# Patient Record
Sex: Male | Born: 1950 | Race: White | Hispanic: No | Marital: Single | Smoking: Never smoker
Health system: Southern US, Community
[De-identification: ages and names within clinical notes are randomized; demographics above are authoritative.]

## PROBLEM LIST (undated history)

## (undated) DIAGNOSIS — I10 Essential (primary) hypertension: Secondary | ICD-10-CM

---

## 2006-12-10 ENCOUNTER — Ambulatory Visit: Payer: Self-pay | Admitting: Orthopedic Surgery

## 2007-02-18 ENCOUNTER — Ambulatory Visit: Payer: Self-pay | Admitting: Orthopedic Surgery

## 2010-01-04 ENCOUNTER — Emergency Department: Payer: Self-pay | Admitting: Emergency Medicine

## 2015-02-02 ENCOUNTER — Other Ambulatory Visit: Payer: Self-pay | Admitting: Internal Medicine

## 2015-02-02 DIAGNOSIS — R0989 Other specified symptoms and signs involving the circulatory and respiratory systems: Secondary | ICD-10-CM

## 2015-02-04 ENCOUNTER — Ambulatory Visit: Payer: Medicare Other

## 2015-02-11 ENCOUNTER — Ambulatory Visit: Payer: Medicare Other

## 2015-04-13 ENCOUNTER — Ambulatory Visit
Admission: RE | Admit: 2015-04-13 | Discharge: 2015-04-13 | Disposition: A | Discharge status: Home / Self Care | Payer: Commercial Managed Care - HMO | Source: Ambulatory Visit | Attending: Internal Medicine | Admitting: Internal Medicine

## 2015-04-13 DIAGNOSIS — I6523 Occlusion and stenosis of bilateral carotid arteries: Secondary | ICD-10-CM | POA: Diagnosis not present

## 2015-04-13 DIAGNOSIS — R0989 Other specified symptoms and signs involving the circulatory and respiratory systems: Secondary | ICD-10-CM | POA: Insufficient documentation

## 2015-11-15 ENCOUNTER — Ambulatory Visit
Admission: RE | Admit: 2015-11-15 | Discharge: 2015-11-15 | Disposition: A | Discharge status: Home / Self Care | Payer: Commercial Managed Care - HMO | Source: Ambulatory Visit | Attending: Cardiology | Admitting: Cardiology

## 2015-11-15 ENCOUNTER — Encounter
Admission: RE | Disposition: A | Discharge status: Home / Self Care | Payer: Self-pay | Source: Ambulatory Visit | Attending: Cardiology

## 2015-11-15 ENCOUNTER — Ambulatory Visit: Payer: Commercial Managed Care - HMO | Admitting: Certified Registered"

## 2015-11-15 ENCOUNTER — Encounter: Payer: Self-pay | Admitting: *Deleted

## 2015-11-15 DIAGNOSIS — I10 Essential (primary) hypertension: Secondary | ICD-10-CM | POA: Diagnosis not present

## 2015-11-15 DIAGNOSIS — Z806 Family history of leukemia: Secondary | ICD-10-CM | POA: Insufficient documentation

## 2015-11-15 DIAGNOSIS — Z8249 Family history of ischemic heart disease and other diseases of the circulatory system: Secondary | ICD-10-CM | POA: Diagnosis not present

## 2015-11-15 DIAGNOSIS — Z8379 Family history of other diseases of the digestive system: Secondary | ICD-10-CM | POA: Diagnosis not present

## 2015-11-15 DIAGNOSIS — Z79899 Other long term (current) drug therapy: Secondary | ICD-10-CM | POA: Diagnosis not present

## 2015-11-15 DIAGNOSIS — Z823 Family history of stroke: Secondary | ICD-10-CM | POA: Diagnosis not present

## 2015-11-15 DIAGNOSIS — Z791 Long term (current) use of non-steroidal anti-inflammatories (NSAID): Secondary | ICD-10-CM | POA: Insufficient documentation

## 2015-11-15 DIAGNOSIS — Z8349 Family history of other endocrine, nutritional and metabolic diseases: Secondary | ICD-10-CM | POA: Insufficient documentation

## 2015-11-15 DIAGNOSIS — I480 Paroxysmal atrial fibrillation: Secondary | ICD-10-CM | POA: Insufficient documentation

## 2015-11-15 DIAGNOSIS — I481 Persistent atrial fibrillation: Secondary | ICD-10-CM | POA: Diagnosis not present

## 2015-11-15 DIAGNOSIS — Z800 Family history of malignant neoplasm of digestive organs: Secondary | ICD-10-CM | POA: Diagnosis not present

## 2015-11-15 DIAGNOSIS — Z7901 Long term (current) use of anticoagulants: Secondary | ICD-10-CM | POA: Insufficient documentation

## 2015-11-15 DIAGNOSIS — Z8042 Family history of malignant neoplasm of prostate: Secondary | ICD-10-CM | POA: Insufficient documentation

## 2015-11-15 HISTORY — PX: ELECTROPHYSIOLOGIC STUDY: SHX172A

## 2015-11-15 HISTORY — DX: Essential (primary) hypertension: I10

## 2015-11-15 SURGERY — CARDIOVERSION (CATH LAB)
Anesthesia: General

## 2015-11-15 MED ORDER — SODIUM CHLORIDE 0.9 % IV SOLN
INTRAVENOUS | Status: DC | PRN
  Administered 2015-11-15: 07:00:00 via INTRAVENOUS
  Administered 2015-11-15: 07:00:00

## 2015-11-15 MED ORDER — PROPOFOL 10 MG/ML IV BOLUS
INTRAVENOUS | Status: DC | PRN
  Administered 2015-11-15: 80 mg via INTRAVENOUS

## 2015-11-15 NOTE — Transfer of Care (Signed)
Immediate Anesthesia Transfer of Care Note  Patient: Dale Stewart  Procedure(s) Performed: Procedure(s): CARDIOVERSION (N/A)  Patient Location: PACU and Cath Lab  Anesthesia Type:MAC  Level of Consciousness: sedated and responds to stimulation  Airway & Oxygen Therapy: Patient Spontanous Breathing and Patient connected to nasal cannula oxygen  Post-op Assessment: Report given to RN and Post -op Vital signs reviewed and stable  Post vital signs: Reviewed and stable  Last Vitals:  Vitals:   11/15/15 0742 11/15/15 0743  BP:  111/86  Pulse: 73   Resp: 19 14  Temp:      Last Pain: There were no vitals filed for this visit.       Complications: No apparent anesthesia complications

## 2015-11-15 NOTE — Anesthesia Postprocedure Evaluation (Signed)
Anesthesia Post Note  Patient: Alta CorningMichael Koegel  Procedure(s) Performed: Procedure(s) (LRB): CARDIOVERSION (N/A)  Patient location during evaluation: PACU Anesthesia Type: General Level of consciousness: awake and alert Pain management: pain level controlled Vital Signs Assessment: post-procedure vital signs reviewed and stable Respiratory status: spontaneous breathing, nonlabored ventilation, respiratory function stable and patient connected to nasal cannula oxygen Cardiovascular status: blood pressure returned to baseline and stable Postop Assessment: no signs of nausea or vomiting Anesthetic complications: no    Last Vitals:  Vitals:   11/15/15 0743 11/15/15 0749  BP: 111/86 109/71  Pulse:  74  Resp: 14 16  Temp:      Last Pain: There were no vitals filed for this visit.               Yevette EdwardsJames G Adams

## 2015-11-15 NOTE — Anesthesia Preprocedure Evaluation (Signed)
Anesthesia Evaluation  Patient identified by MRN, date of birth, ID band Patient awake    Reviewed: Allergy & Precautions, H&P , NPO status , Patient's Chart, lab work & pertinent test results, reviewed documented beta blocker date and time   Airway Mallampati: II   Neck ROM: full    Dental  (+) Teeth Intact   Pulmonary neg pulmonary ROS,    Pulmonary exam normal        Cardiovascular hypertension, negative cardio ROS Normal cardiovascular examAtrial Fibrillation      Neuro/Psych negative neurological ROS  negative psych ROS   GI/Hepatic negative GI ROS, Neg liver ROS,   Endo/Other  negative endocrine ROS  Renal/GU negative Renal ROS  negative genitourinary   Musculoskeletal   Abdominal   Peds  Hematology negative hematology ROS (+)   Anesthesia Other Findings Past Medical History: No date: Hypertension History reviewed. No pertinent surgical history. BMI    Body Mass Index:  34.02 kg/m     Reproductive/Obstetrics                             Anesthesia Physical Anesthesia Plan  ASA: III  Anesthesia Plan: General   Post-op Pain Management:    Induction:   Airway Management Planned:   Additional Equipment:   Intra-op Plan:   Post-operative Plan:   Informed Consent: I have reviewed the patients History and Physical, chart, labs and discussed the procedure including the risks, benefits and alternatives for the proposed anesthesia with the patient or authorized representative who has indicated his/her understanding and acceptance.   Dental Advisory Given  Plan Discussed with: CRNA  Anesthesia Plan Comments:         Anesthesia Quick Evaluation

## 2015-11-15 NOTE — Anesthesia Procedure Notes (Signed)
Procedure Name: MAC Performed by: Shahzaib Azevedo Pre-anesthesia Checklist: Patient identified, Emergency Drugs available, Suction available, Patient being monitored and Timeout performed Patient Re-evaluated:Patient Re-evaluated prior to inductionOxygen Delivery Method: Nasal cannula Intubation Type: IV induction       

## 2015-11-15 NOTE — CV Procedure (Signed)
Procedure Note.  Procedure:  DCCV Indication: symptomatic afib Sedation: Moderate sedation per Dept of anesthesia  After informed consent, time out protocol and adequate sedation, Pt received 120J of synchronized biphasic DCCV with conversion to nsr. No immediate complications.

## 2015-11-15 NOTE — Discharge Instructions (Signed)
Electrical Cardioversion, Care After °Refer to this sheet in the next few weeks. These instructions provide you with information on caring for yourself after your procedure. Your health care provider may also give you more specific instructions. Your treatment has been planned according to current medical practices, but problems sometimes occur. Call your health care provider if you have any problems or questions after your procedure. °WHAT TO EXPECT AFTER THE PROCEDURE °After your procedure, it is typical to have the following sensations: °· Some redness on the skin where the shocks were delivered. If this is tender, a sunburn lotion or hydrocortisone cream may help. °· Possible return of an abnormal heart rhythm within hours or days after the procedure. °HOME CARE INSTRUCTIONS °· Take medicines only as directed by your health care provider. Be sure you understand how and when to take your medicine. °· Learn how to feel your pulse and check it often. °· Limit your activity for 48 hours after the procedure or as directed by your health care provider. °· Avoid or minimize caffeine and other stimulants as directed by your health care provider. °SEEK MEDICAL CARE IF: °· You feel like your heart is beating too fast or your pulse is not regular. °· You have any questions about your medicines. °· You have bleeding that will not stop. °SEEK IMMEDIATE MEDICAL CARE IF: °· You are dizzy or feel faint. °· It is hard to breathe or you feel short of breath. °· There is a change in discomfort in your chest. °· Your speech is slurred or you have trouble moving an arm or leg on one side of your body. °· You get a serious muscle cramp that does not go away. °· Your fingers or toes turn cold or blue. °  °This information is not intended to replace advice given to you by your health care provider. Make sure you discuss any questions you have with your health care provider. °  °Document Released: 01/01/2013 Document Revised: 04/03/2014  Document Reviewed: 01/01/2013 °Elsevier Interactive Patient Education ©2016 [employer] ° °

## 2015-11-17 NOTE — H&P (Signed)
Chief Complaint: Chief Complaint  Patient presents with  . Follow-up  NP per tate pressure upper stomach since last thursday its gets worse  . Shortness of Breath  its almost like Im huffing and puffing  . Abnormal ECG  Dr Arlana Pouchate did one  . other  goes to the Gym ---was going 5 days aweek but has a shoulder issues so going 3 x week  Date of Service: 10/11/2015 Date of Birth: 1950/12/28 PCP: Jillene BucksENNY C TATE, MD  History of Present Illness: Mr. Mayme GentaBaughn is a 65 y.o.male patient who presents for evaluation and referral from Dr. Arlana Pouchate after noting atrial fibrillation on electrocardiogram. Patient has no prior cardiac history. Etiology was unclear. Holter monitor revealed atrial fibrillation with variable ventricular response. Echocardiogram showed an ejection fraction of 40% to 45% with mildly enlarged left atrium. Mild MR. He is symptomatic from this. Rate is fairly well-controlled. He is tolerating anticoagulation well. Risk and benefits of cardioversion versus maintaining atrial fibrillation with rate control and anticoagulation versus ablation was discussed. Past Medical and Surgical History  Past Medical History Past Medical History:  Diagnosis Date  . Hypertension   Past Surgical History He has no past surgical history on file.   Medications and Allergies  Current Medications  Current Outpatient Prescriptions  Medication Sig Dispense Refill  . lisinopril (PRINIVIL,ZESTRIL) 20 MG tablet 1 tab by mouth daily  . testosterone cypionate (DEPO-TESTOSTERONE) 200 mg/mL injection  . *vitamin b complex oral 1 tab by mouth daily (Patient not taking: Reported on 10/11/2015 )  . apixaban (ELIQUIS) 5 mg tablet Take 1 tablet (5 mg total) by mouth 2 (two) times daily. 60 tablet 0  . apixaban (ELIQUIS) 5 mg tablet Take 1 tablet (5 mg total) by mouth 2 (two) times daily. 60 tablet 11  . diltiazem (CARDIZEM CD) 120 MG XR capsule Take 1 capsule (120 mg total) by mouth once daily. 30 capsule 11  . naproxen  (NAPROSYN) 500 MG tablet TAKE 1 TABLET BY MOUTH TWICE A DAY AS NEEDED 5   No current facility-administered medications for this visit.   Allergies: Review of patient's allergies indicates no known allergies.  Social and Family History  Social History reports that he has never smoked. He has never used smokeless tobacco. He reports that he drinks about 1.2 oz of alcohol per week He reports that he does not use illicit drugs.  Family History Family History  Problem Relation Age of Onset  . Heart failure Mother  . Stroke Mother  . Prostate cancer Brother  . Heart disease Brother  . Stomach cancer Brother  . Heart disease Brother  . Leukemia Brother  . Cirrhosis Brother  . Thyroid disease Brother   Review of Systems  Review of Systems  Constitutional: Positive for malaise/fatigue. Negative for chills, diaphoresis, fever and weight loss.  HENT: Negative for congestion, ear discharge, hearing loss and tinnitus.  Eyes: Negative for blurred vision.  Respiratory: Positive for shortness of breath. Negative for cough, hemoptysis, sputum production and wheezing.  Cardiovascular: Negative for chest pain, palpitations, orthopnea, claudication, leg swelling and PND.  Gastrointestinal: Negative for abdominal pain, blood in stool, constipation, diarrhea, heartburn, melena, nausea and vomiting.  Genitourinary: Negative for dysuria, frequency, hematuria and urgency.  Musculoskeletal: Negative for back pain, falls, joint pain and myalgias.  Skin: Negative for itching and rash.  Neurological: Negative for dizziness, tingling, focal weakness, loss of consciousness, weakness and headaches.  Endo/Heme/Allergies: Negative for polydipsia. Does not bruise/bleed easily.  Psychiatric/Behavioral: Negative for depression, memory  loss and substance abuse. The patient is not nervous/anxious.   Physical Examination   Vitals:BP 142/80 (BP Location: Left upper arm, Patient Position: Sitting, BP Cuff Size: Adult)   Pulse 88  Resp 12  Ht 185.4 cm (6\' 1" )  Wt (!) 121.7 kg (268 lb 6.4 oz)  BMI 35.41 kg/m2 Ht:185.4 cm (6\' 1" ) Wt:(!) 121.7 kg (268 lb 6.4 oz) JYN:WGNFBSA:Body surface area is 2.5 meters squared. Body mass index is 35.41 kg/(m^2).  Wt Readings from Last 3 Encounters:  10/11/15 (!) 121.7 kg (268 lb 6.4 oz)  07/08/14 (!) 114.8 kg (253 lb)  04/28/10 (!) 114.8 kg (253 lb)   BP Readings from Last 3 Encounters:  10/11/15 142/80  07/08/14 134/82  04/28/10 (!) 165/105   General appearance appears in no acute distress  Head Mouth and Eye exam Normocephalic, without obvious abnormality, atraumatic Dentition is good Eyes appear anicteric   Neck exam Thyroid: normal  Nodes: no obvious adenopathy  LUNGS Breath Sounds: Normal Percussion: Normal  CARDIOVASCULAR JVP CV wave: no HJR: no Elevation at 90 degrees: None Carotid Pulse: normal pulsation bilaterally Bruit: None Apex: apical impulse normal  Auscultation Rhythm: atrial fibrillation S1: normal S2: normal Clicks: no Rub: no Murmurs: no murmurs  Gallop: None ABDOMEN Liver enlargement: no Pulsatile aorta: no Ascites: no Bruits: no  EXTREMITIES Clubbing: no Edema: trace to 1+ bilateral pedal edema Pulses: peripheral pulses symmetrical Femoral Bruits: no Amputation: no SKIN Rash: no Cyanosis: no Embolic phemonenon: no Bruising: no NEURO Alert and Oriented to person, place and time: yes Non focal: yes  PSYCH: Pt appears to have normal affect  Diagnostic Studies Reviewed:  EKG EKG demonstrated atrial fibrillation, rate 116-120. No ischemia noted.  Assessment and Plan   65 y.o. male with  ICD-10-CM ICD-9-CM  1. Paroxysmal atrial fibrillation (CMS-HCC)-patient appears to be new onset atrial fibrillation. Appears to be in chronic atrial fibrillation or at least persistent. Risks and benefits of cardioversion after anticoagulation were discussed. Plan to proceed with attempt at cardioversion on current meds in  late August after being on anticoagulation for greater than 4 weeks. Further recognitions after this is complete. Avoiding alcohol stimulants were discussed. I48.0 427.31       2. SOB (shortness of breath)-as per above. Received cardioversion to attempt to maintain sinus rhythm. Continue with anticoagulation with apixaban and diltiazem. R06.02 786.05   Return in about 4 weeks (around 11/24/2015).  These notes generated with voice recognition software. I apologize for typographical errors.  Denton ArKENNETH ALAN FATH, MD

## 2017-03-13 ENCOUNTER — Other Ambulatory Visit: Payer: Self-pay | Admitting: Surgery

## 2017-03-13 DIAGNOSIS — M19012 Primary osteoarthritis, left shoulder: Secondary | ICD-10-CM

## 2017-04-04 ENCOUNTER — Ambulatory Visit
Admission: RE | Admit: 2017-04-04 | Discharge: 2017-04-04 | Disposition: A | Discharge status: Home / Self Care | Payer: Medicare HMO | Source: Ambulatory Visit | Attending: Surgery | Admitting: Surgery

## 2017-04-04 DIAGNOSIS — M19012 Primary osteoarthritis, left shoulder: Secondary | ICD-10-CM | POA: Insufficient documentation

## 2017-04-04 DIAGNOSIS — M25712 Osteophyte, left shoulder: Secondary | ICD-10-CM | POA: Insufficient documentation

## 2017-04-04 DIAGNOSIS — M24012 Loose body in left shoulder: Secondary | ICD-10-CM | POA: Diagnosis not present

## 2017-10-19 ENCOUNTER — Other Ambulatory Visit: Payer: Self-pay | Admitting: Cardiology

## 2017-10-19 DIAGNOSIS — G3281 Cerebellar ataxia in diseases classified elsewhere: Secondary | ICD-10-CM

## 2017-10-31 ENCOUNTER — Other Ambulatory Visit: Payer: Self-pay | Admitting: Cardiology

## 2017-10-31 DIAGNOSIS — I10 Essential (primary) hypertension: Secondary | ICD-10-CM

## 2017-11-01 ENCOUNTER — Other Ambulatory Visit
Admission: RE | Admit: 2017-11-01 | Discharge: 2017-11-01 | Disposition: A | Discharge status: Home / Self Care | Payer: Medicare HMO | Source: Ambulatory Visit | Attending: Rehabilitative and Restorative Service Providers" | Admitting: Rehabilitative and Restorative Service Providers"

## 2017-11-01 ENCOUNTER — Ambulatory Visit
Admission: RE | Admit: 2017-11-01 | Discharge: 2017-11-01 | Disposition: A | Discharge status: Home / Self Care | Payer: Medicare HMO | Source: Ambulatory Visit | Attending: Cardiology | Admitting: Cardiology

## 2017-11-01 DIAGNOSIS — G930 Cerebral cysts: Secondary | ICD-10-CM | POA: Diagnosis not present

## 2017-11-01 DIAGNOSIS — I10 Essential (primary) hypertension: Secondary | ICD-10-CM | POA: Diagnosis present

## 2017-11-01 DIAGNOSIS — G3281 Cerebellar ataxia in diseases classified elsewhere: Secondary | ICD-10-CM

## 2017-11-01 DIAGNOSIS — R270 Ataxia, unspecified: Secondary | ICD-10-CM | POA: Diagnosis present

## 2017-11-01 LAB — BASIC METABOLIC PANEL
Anion gap: 11 (ref 5–15)
BUN: 23 mg/dL (ref 8–23)
CALCIUM: 8.6 mg/dL — AB (ref 8.9–10.3)
CO2: 24 mmol/L (ref 22–32)
CREATININE: 1.03 mg/dL (ref 0.61–1.24)
Chloride: 103 mmol/L (ref 98–111)
GFR calc non Af Amer: >60 mL/min (ref >60)
Glucose, Bld: 97 mg/dL (ref 70–99)
Potassium: 3.8 mmol/L (ref 3.5–5.1)
SODIUM: 138 mmol/L (ref 135–145)

## 2017-11-01 MED ORDER — GADOBENATE DIMEGLUMINE 529 MG/ML IV SOLN
20.0000 mL | Freq: Once | INTRAVENOUS | Status: AC | PRN
  Administered 2017-11-01: 20 mL via INTRAVENOUS

## 2017-11-23 ENCOUNTER — Other Ambulatory Visit: Payer: Self-pay | Admitting: Orthopedic Surgery

## 2017-11-23 DIAGNOSIS — M545 Low back pain: Secondary | ICD-10-CM

## 2018-01-30 ENCOUNTER — Other Ambulatory Visit: Payer: Self-pay | Admitting: Neurology

## 2018-01-30 DIAGNOSIS — R29898 Other symptoms and signs involving the musculoskeletal system: Secondary | ICD-10-CM

## 2018-02-19 ENCOUNTER — Ambulatory Visit
Admission: RE | Admit: 2018-02-19 | Discharge: 2018-02-19 | Disposition: A | Discharge status: Home / Self Care | Payer: Medicare HMO | Source: Ambulatory Visit | Attending: Neurology | Admitting: Neurology

## 2018-02-19 DIAGNOSIS — M48061 Spinal stenosis, lumbar region without neurogenic claudication: Secondary | ICD-10-CM | POA: Diagnosis not present

## 2018-02-19 DIAGNOSIS — R222 Localized swelling, mass and lump, trunk: Secondary | ICD-10-CM | POA: Insufficient documentation

## 2018-02-19 DIAGNOSIS — M4317 Spondylolisthesis, lumbosacral region: Secondary | ICD-10-CM | POA: Diagnosis not present

## 2018-02-19 DIAGNOSIS — M545 Low back pain: Secondary | ICD-10-CM | POA: Diagnosis present

## 2018-02-19 DIAGNOSIS — R29898 Other symptoms and signs involving the musculoskeletal system: Secondary | ICD-10-CM

## 2019-06-05 ENCOUNTER — Ambulatory Visit
Admission: RE | Admit: 2019-06-05 | Discharge: 2019-06-05 | Disposition: A | Discharge status: Home / Self Care | Payer: Medicare HMO | Source: Ambulatory Visit | Attending: Internal Medicine | Admitting: Internal Medicine

## 2019-06-05 ENCOUNTER — Other Ambulatory Visit: Payer: Self-pay

## 2019-06-05 DIAGNOSIS — I440 Atrioventricular block, first degree: Secondary | ICD-10-CM | POA: Insufficient documentation

## 2019-06-05 DIAGNOSIS — Z01810 Encounter for preprocedural cardiovascular examination: Secondary | ICD-10-CM | POA: Diagnosis not present

## 2019-12-03 ENCOUNTER — Other Ambulatory Visit: Payer: Self-pay | Admitting: Internal Medicine

## 2019-12-03 DIAGNOSIS — R229 Localized swelling, mass and lump, unspecified: Secondary | ICD-10-CM

## 2019-12-08 ENCOUNTER — Ambulatory Visit: Admission: RE | Admit: 2019-12-08 | Payer: Medicare HMO | Source: Ambulatory Visit

## 2019-12-11 ENCOUNTER — Ambulatory Visit: Admission: RE | Admit: 2019-12-11 | Payer: Medicare HMO | Source: Ambulatory Visit

## 2019-12-18 ENCOUNTER — Other Ambulatory Visit: Payer: Self-pay

## 2019-12-18 ENCOUNTER — Ambulatory Visit
Admission: RE | Admit: 2019-12-18 | Discharge: 2019-12-18 | Disposition: A | Discharge status: Home / Self Care | Payer: Medicare HMO | Source: Ambulatory Visit | Attending: Internal Medicine | Admitting: Internal Medicine

## 2019-12-18 ENCOUNTER — Other Ambulatory Visit
Admission: RE | Admit: 2019-12-18 | Discharge: 2019-12-18 | Disposition: A | Discharge status: Home / Self Care | Payer: Medicare HMO | Source: Ambulatory Visit | Attending: Internal Medicine | Admitting: Internal Medicine

## 2019-12-18 ENCOUNTER — Encounter (INDEPENDENT_AMBULATORY_CARE_PROVIDER_SITE_OTHER): Payer: Self-pay

## 2019-12-18 DIAGNOSIS — R229 Localized swelling, mass and lump, unspecified: Secondary | ICD-10-CM | POA: Diagnosis not present

## 2019-12-18 LAB — CREATININE, SERUM
Creatinine, Ser: 0.99 mg/dL (ref 0.61–1.24)
GFR calc Af Amer: >60 mL/min (ref >60)
GFR calc non Af Amer: >60 mL/min (ref >60)

## 2019-12-18 MED ORDER — IOHEXOL 300 MG/ML  SOLN
75.0000 mL | Freq: Once | INTRAMUSCULAR | Status: AC | PRN
  Administered 2019-12-18: 75 mL via INTRAVENOUS

## 2021-06-18 IMAGING — CT CT NECK W/ CM
2 of 3 series · 8 of 14 positions shown, 10 images · IV contrast (omnipaque)
Comparison: Brain MRI 11/01/2017. Carotid Doppler ultrasound
04/13/2015.

CLINICAL DATA: 69-year-old male with palpable mass on clavicle
discovered 2-3 weeks ago. Associated new difficulty swallowing.

EXAM:
CT NECK WITH CONTRAST
TECHNIQUE: Multidetector CT imaging of the neck was performed using the
standard protocol following the bolus administration of intravenous
contrast.
CONTRAST:  75mL OMNIPAQUE IOHEXOL 300 MG/ML  SOLN

[Series 2: axial neck · axial · 0.66mm/px · z∈[-478,-354]mm · 3 of 125 slices shown]
[im 32/125  bone]
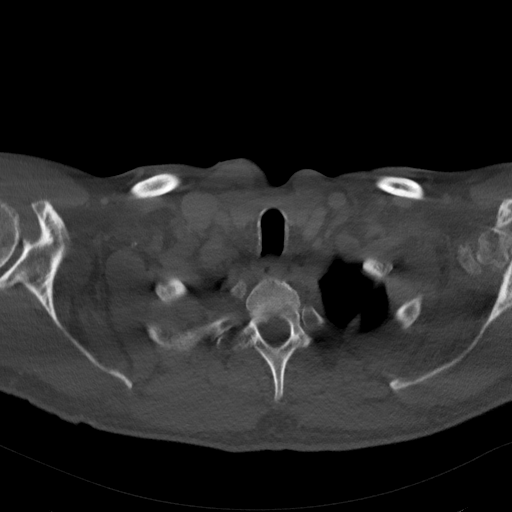
[im 63/125  bone]
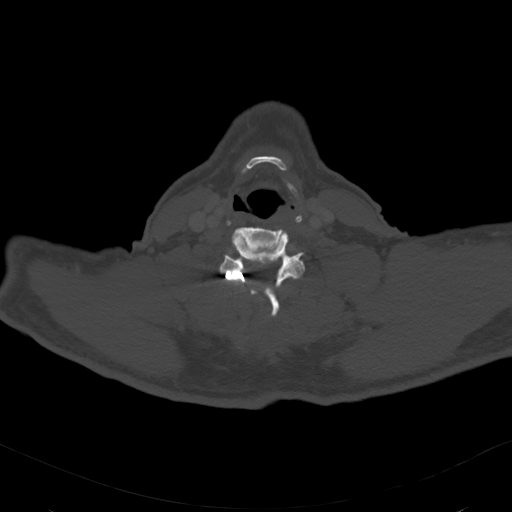
[im 94/125  bone]
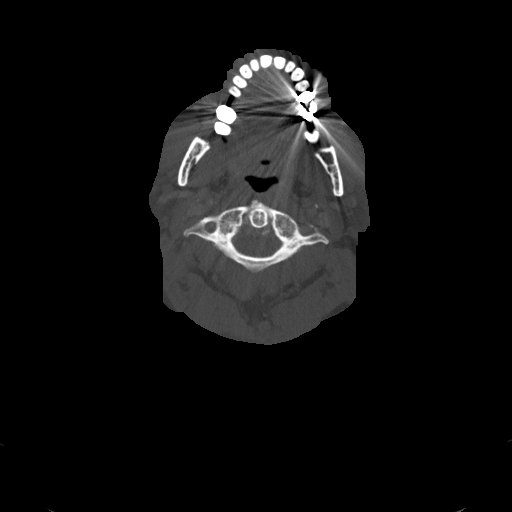

[Series 7: orthogonal ax · axial · 0.49mm/px · z∈[-547,-346]mm · 5 of 155 slices shown, 7 images]
[im 26/155  soft-tissue]
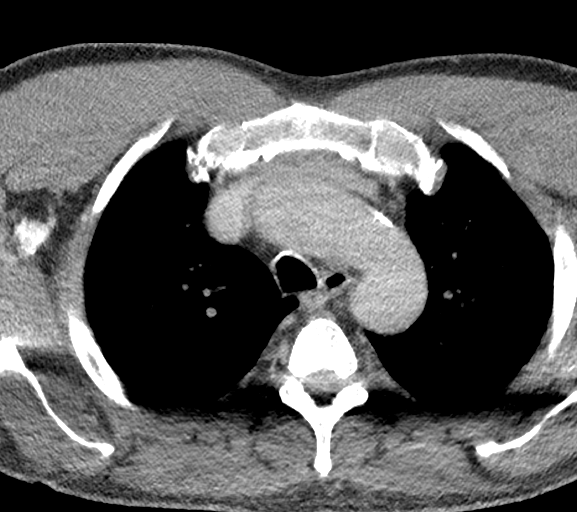
[im 26/155  bone]
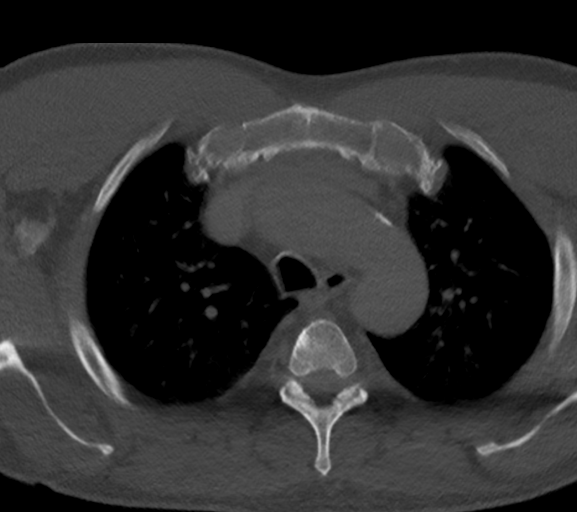
[im 52/155  bone]
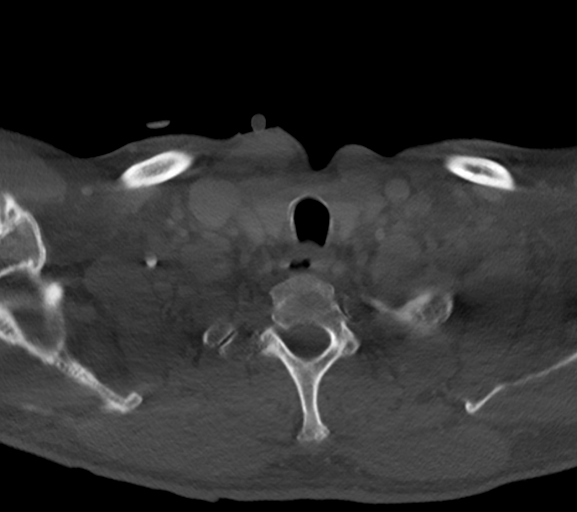
[im 78/155  bone]
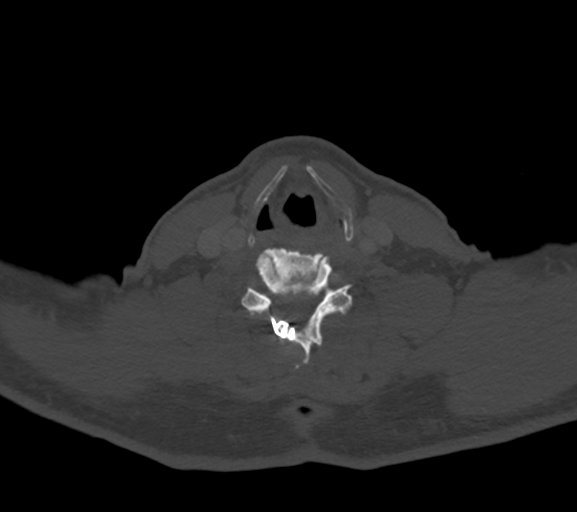
[im 103/155  bone]
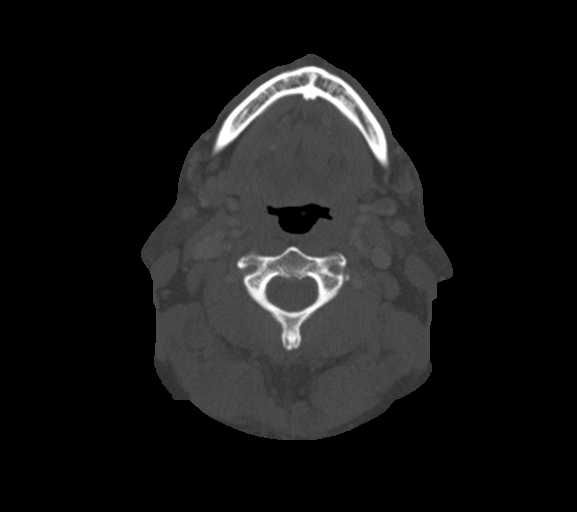
[im 129/155  soft-tissue]
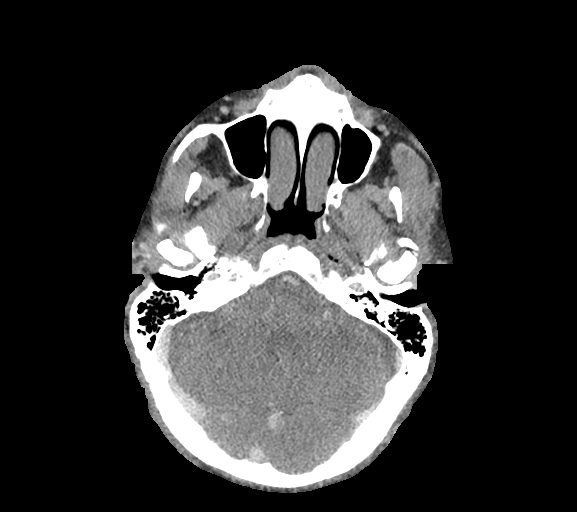
[im 129/155  bone]
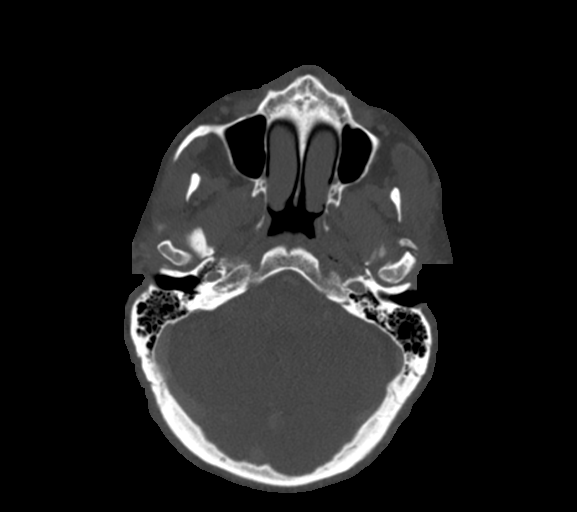

[8 of 14 positions shown; findings below may reference images not displayed]

FINDINGS: Pharynx and larynx: Laryngeal and pharyngeal soft tissue contours
are within normal limits. Parapharyngeal and retropharyngeal spaces
appear normal.

Cervical esophagus appears within normal limits.

Salivary glands: Sublingual space, submandibular glands, and parotid
glands are within normal limits.

Thyroid: Negative.

Lymph nodes: No cervical lymphadenopathy. Among the largest lymph
nodes is a left level 4 node measuring 6 mm short axis (up to 10 mm
on coronal image 66). No acute inflammatory stranding identified in
the neck.

Vascular: Major vascular structures in the neck and at the skull
base appear patent. There is moderate atherosclerosis of the
proximal left ICA.

Limited intracranial: Negative.

Visualized orbits: Negative.

Mastoids and visualized paranasal sinuses: Clear.

Skeleton: Palpable area of concern marked along the anteromedial
margin of the right clavicle on series 2, image 97. And on bone
windows series 4, image 107 and coronal series 6, image 42 severe
asymmetric degeneration of the right sternoclavicular joint is
noted. There is associated abnormal sclerosis of the medial
clavicular head, subchondral cysts, and joint space mildly increased
intermediate density soft tissue. But no aggressive or suspicious
bone changes or features are identified. Subjacent to the
degenerated joint is tortuosity of the proximal great vessels off of
the aortic arch.

Superimposed severe chronic cervical spine degeneration with
multilevel chronic postoperative changes to the right cervical
lamina.

Partially visible bulky degeneration and heterotopic ossification
also at the visible left shoulder.

No acute or suspicious osseous lesion identified.

Upper chest: Negative visible superior mediastinum aside from
tortuosity of the proximal great vessels and calcified
atherosclerosis of the aortic arch. Negative visible thoracic
esophagus. Mild motion artifact in the lung apices which appear
negative.
IMPRESSION: 1. Palpable area of concern corresponds to severe asymmetric
degeneration of the right sternoclavicular joint. No aggressive or
suspicious bone changes or other features are identified.

2. Otherwise largely negative neck CT;
- moderate atherosclerosis of the proximal Left ICA. Repeat Carotid
Doppler Ultrasound might be valuable.
- chronic severe cervical spine degeneration with multilevel
postoperative changes on the right.

## 2023-08-08 NOTE — Progress Notes (Signed)
 Division of Spine Surgery    History of Present Illness: History of Present Illness Dale Stewart is a 73 year old male with chronic hip and sacroiliac joint pain who presents with worsening leg pain.  He experiences worsening pain originating from the sacroiliac joint, radiating down his entire leg, with a noted bone protrusion in the area. The pain has intensified, causing difficulty in management. A recent fall occurred due to his leg giving way, raising concerns about potential damage to previous surgical sites. He has undergone a myelogram CT scan and new x-rays.  He has received sacroiliac joint injections with limited relief. A double dose provided significant improvement, but the pain has since returned, affecting his entire leg, including the knee. Previous hip injections did not provide relief, possibly due to insufficient dosage. He is frustrated with the ongoing pain and its impact on his mobility and quality of life.   Spine Questionnaires:  SPINE SCORING PORTION OF QUESTIONNAIRES NDI SCORE-PERCENTAGE ODI SCORE-PERCENTAGE NECK PAIN ARM PAIN BACK PAIN LEG PAIN PROMIS Pain Interference T-Score (range: 10 - 90) --  03/16/2021  76 (Crippled) 0 (None) 0 (None) 10 (Very Severe) 10 (Very Severe) 73 (severe)*   01/18/2022 8 (No Disability) 48 (Severe Disability) 0 (None) 2 (Mild) 10 (Very Severe) 10 (Very Severe) 62 (moderate)*     SPINE SCORING PORTION OF QUESTIONNAIRES PROMIS Physical Function T-Score [facility] MyChart PROMIS Global Health Scoring (Physical) [facility] MyChart PROMIS Global Health Scoring (Mental)  03/16/2021 26 (severe dysfunction)* 23.5 (Severe Symptoms) 43.5 (Mild Symptoms)  01/18/2022 52 (within normal limits) 37.4 (Moderate Symptoms) 43.5 (Mild Symptoms)       01/18/2022    9:47 AM 01/18/2022    9:46 AM 01/18/2022    9:43 AM 03/16/2021    2:21 PM 03/16/2021    2:20 PM 03/16/2021    2:19 PM 03/16/2021    2:17 PM  SPINE SCORING PORTION OF QUESTIONNAIRES  NDI  SCORE-PERCENTAGE   8 (No Disability)      ODI SCORE-PERCENTAGE   48 (Severe Disability)    76 (Crippled)  NECK PAIN   0 (None)    0 (None)  ARM PAIN   2 (Mild)    0 (None)  BACK PAIN   10 (Very Severe)    10 (Very Severe)  LEG PAIN   10 (Very Severe)    10 (Very Severe)  PROMIS Pain Interference T-Score (range: 10 - 90) 62 (moderate)*   73 (severe)*     --         PROMIS Physical Function T-Score 52 (within normal limits)    26 (severe dysfunction)*    [facility] MyChart PROMIS Global Health Scoring (Physical)  37.4 (Moderate Symptoms)    23.5 (Severe Symptoms)   [facility] MyChart PROMIS Global Health Scoring (Mental)  43.5 (Mild Symptoms)    43.5 (Mild Symptoms)    @spinescorecard @   For PROMIS measures, higher scores equals more of the concept being measured.  PROMIS scores have a mean of 50 and standard deviation (SD) of 10. Scores 0.5 - 1.0 SD worse than the mean = mild symptoms/impairment Scores 1.0 - 2.0 SD worse than the mean = moderate symptoms/impairment Scores 2.0 SD or more worse than the mean = severe symptoms/impairment  Interpretation of PROMIS Scores WNL Mild Symptoms/Impairment Moderate Symptoms/Impairment Severe Symptoms/Impairment  Physical Function (Adult and Pediatric) 45 or greater 40-44 30-39 29 or less  Adult Pain Interference 54 or less 55-59 60-69 70 or greater  Depression 54 or less 55-59 60-69  70 or greater  Sleep Disturbance 54 or less 55-59 60-69 70 or greater  Pediatric or Parent Proxy Pain Interference 50 or less 50-54 55-64 65 or greater  Cognitive Function  45 or greater 40-44  30-39 29 or less   Past Medical History: Past Medical History:  Diagnosis Date  . Atrial fibrillation (CMS/HHS-HCC)   . Cardiomyopathy, secondary (CMS/HHS-HCC)   . GERD (gastroesophageal reflux disease)   . Hypertension      Past Surgical History: Past Surgical History:  Procedure Laterality Date  . POSTERIOR LUMBAR SPINE FUSION ONE LEVEL LATERAL TRANSVERSE TECHNIQUE N/A 12/01/2021    Procedure: **AIRO 1** ARTHRODESIS, POSTERIOR OR POSTEROLATERAL TECHNIQUE, SINGLE LEVEL; LUMBAR (WITH LATERAL TRANSVERSE TECHNIQUE, WHEN PERFORMED); STEREOTACTIC / 3D SONDRA;  Surgeon: Estelle Elsie Agent, MD;  Location: [facility];  Service: Orthopedics;  Laterality: N/A;  . INSTRUMENTATION POSTERIOR SPINE 7 TO 12 VERTEBRAL SEGMENTS N/A 12/01/2021   Procedure: POSTERIOR SEGMENTAL INSTRUMENTATION (EG, PEDICLE FIXATION, DUAL RODS WITH MULTIPLE HOOKS AND SUBLAMINAR WIRES); 3 TO 6 VERTEBRAL SEGMENTS (LIST IN ADDITION TO PRIMARY PROCEDURE);  Surgeon: Estelle Elsie Agent, MD;  Location: [facility];  Service: Orthopedics;  Laterality: N/A;  . ARTHRODESIS POSTERIOR SPINE N/A 12/01/2021   Procedure: ARTHRODESIS, POSTERIOR OR POSTEROLATERAL TECHNIQUE, SINGLE LEVEL; EACH ADDITIONAL VERTEBRAL SEGMENT (LIST IN ADDITION TO PRIMARY PROCEDURE);  Surgeon: Estelle Elsie Agent, MD;  Location: [facility];  Service: Orthopedics;  Laterality: N/A;  . INSERTION MORSELIZED BONE ALLOGRAFT FOR SPINE SURGERY N/A 12/01/2021   Procedure: ALLOGRAFT, MORSELIZED, OR PLACEMENT OF OSTEOPROMOTIVE MATERIAL, FOR SPINE SURGERY ONLY (LIST IN ADDITION TO PRIMARY PROCEDURE);  Surgeon: Estelle Elsie Agent, MD;  Location: [facility];  Service: Orthopedics;  Laterality: N/A;  . AUTOGRAFT OBTAINED SAME INCISION FOR SPINE SURGERY N/A 12/01/2021   Procedure: AUTOGRAFT FOR SPINE SURGERY ONLY (INCL HARVESTING GRAFT); LOCAL (EG, RIBS, SPINOUS PROCESS, OR LAMINAR FRAGMENT) OBTAINED FROM SAME INCISION (LIST IN ADDITION TO PRIMARY PROCEDURE);  Surgeon: Estelle Elsie Agent, MD;  Location: [facility];  Service: Orthopedics;  Laterality: N/A;  . LAMINECTOMY POSTERIOR LUMBAR FACETECTOMY & FORAMINOTOMY W/DECOMP N/A 12/01/2021   Procedure: LAMINECTOMY, FACETECTOMY AND FORAMINOTOMY (UNILATERAL OR BILATERAL WITH DECOMPRESSION OF SPINAL CORD, CAUDA EQUINA AND/OR NERVE ROOT(S), SINGLE VERTEBRAL  SEGMENT; LUMBAR;  Surgeon: Saturnino Peggye Barrio, MD;  Location: [facility];  Service: Neurosurgery;  Laterality: N/A;  . BACK SURGERY     neck and back  . CARDIAC SURGERY     Afib     Medications: Current Outpatient Medications  Medication Sig Dispense Refill  . aspirin-caffeine (BC ARTHRITIS) 1,000-65 mg PwPk Take 1,000 mg by mouth as directed (pain)    . gabapentin (NEURONTIN) 600 MG tablet Take 600 mg by mouth 3 (three) times daily    . lisinopriL (ZESTRIL) 20 MG tablet Take 20 mg by mouth once daily    . morphine (MS CONTIN) 15 MG ER tablet Take 15 mg by mouth every 12 (twelve) hours    . NARCAN 4 mg/actuation nasal spray Place 1 spray into one nostril 2 (two) times daily as needed    . oxyCODONE (ROXICODONE) 15 MG immediate release tablet Take 15 mg by mouth 5 (five) times daily    . testosterone cypionate (DEPO-TESTOSTERONE) 200 mg/mL injection every 14 (fourteen) days    . tiZANidine (ZANAFLEX) 4 MG tablet TAKE 1 TO 2 TABLETS BY MOUTH AT BEDTIME AS NEEDED FOR INSOMNIA    . aspirin 325 MG EC tablet Take 1 tablet (325 mg total) by mouth  once daily for 30 days (Patient not taking: Reported on 11/03/2022) 30 tablet 0   No current facility-administered medications for this visit.    Allergies: Allergies  Allergen Reactions  . Cinnamon Analogues Other (See Comments)    Large doses cause itching in mouth     Social History: Social History   Socioeconomic History  . Marital status: Single  Tobacco Use  . Smoking status: Never  . Smokeless tobacco: Never  . Tobacco comments:    Smoked in his 32s  Vaping Use  . Vaping status: Never Used  Substance and Sexual Activity  . Alcohol use: Not Currently    Alcohol/week: 2.0 standard drinks of alcohol    Types: 2 Glasses of wine per week    Comment: notes he stopped drink once starting pain med.  . Drug use: No  . Sexual activity: Not Currently   Social Drivers of Health   Financial Resource Strain: Medium Risk  (08/08/2023)   Overall Financial Resource Strain (CARDIA)   . Difficulty of Paying Living Expenses: Somewhat hard  Food Insecurity: Food Insecurity Present (08/08/2023)   Hunger Vital Sign   . Worried About Programme researcher, broadcasting/film/video in the Last Year: Sometimes true   . Ran Out of Food in the Last Year: Sometimes true  Transportation Needs: No Transportation Needs (08/08/2023)   PRAPARE - Transportation   . Lack of Transportation (Medical): No   . Lack of Transportation (Non-Medical): No  Housing Stability: Low Risk  (08/08/2023)   Housing Stability Vital Sign   . Unable to Pay for Housing in the Last Year: No   . Number of Times Moved in the Last Year: 0   . Homeless in the Last Year: No  Recent Concern: Housing Stability - High Risk (08/08/2023)   Housing Stability Vital Sign   . Unable to Pay for Housing in the Last Year: Yes   . Homeless in the Last Year: No      Spine Examination:  Inspection: Gait Normal   Heel Walk Not tested  Toe walk Not Tested   Deformity No deformity   Neural Evaluation: Cervical   Spurling's Normal  Hoffman's Normal  Clonus Normal   Thoraco-Lumbar   Femoral Stretch No back or leg pain  SLR No back or leg pain   Motor:  Cervical Right Left  Deltoid 5/5 5/5  Biceps 5/5 5/5  Triceps 5/5 5/5  Wrist Extensor 5/5 5/5  Wrist Flexor 5/5 5/5  Grip  5/5 5/5  Intrinsics  5/5 5/5   Thoraco-Lumbar Right  Left  Psoas  5/5 5/5  Quad 5/5 5/5  Hamstring  5/5 5/5  Tibialis Anterior  5/5 5/5  Gastroc 5/5 5/5  EHL 5/5 5/5  Peroneals  5/5 5/5  FHL 5/5 5/5    Reflexes: Cervical Right  Left  Biceps Normal Normal  Brachioradialis Normal Normal  Triceps Normal Normal  Hoffman Absent Absent  Inverted Brachioradialis Negative Negative   Thoraco-Lumbar Right Left  Patella Normal Normal  Achilles Normal Normal  Babinski Normal Normal  Clonus Absent Absent    Results Reviewed:   Lab Results  Component Value Date   HCT 42.9 11/21/2022   HCT 45.1  11/13/2022   HCT 26.6 (L) 12/05/2021   HGB 15.1 11/21/2022   HGB 15.5 11/13/2022   HGB 9.5 (L) 12/05/2021   CREATININE 1.0 12/03/2021   CREATININE 0.8 12/02/2021   CREATININE 0.7 12/01/2021   WBC 6.2 11/21/2022   WBC 7.8 11/13/2022  WBC 9.8 12/05/2021   ALB 3.9 11/30/2021     Assessment:     ICD-10-CM  1. Spinal stenosis of lumbar region with neurogenic claudication  M48.062  2. Spondylosis of cervical region without myelopathy or radiculopathy  M47.812     Plan:  Reviewed diagnosis, management options, expected outcomes and reasons for scheduled and emergent follow up. Questions were adequately answered. Patient expressed verbal understanding and agreement with stated plan. Will proceed with the following.  Assessment & Plan Severe hip arthritis Severe hip arthritis is likely the primary pain source. Previous hip injections were ineffective. Myelogram CT scan ruled out spinal surgical intervention. - Administer another SI joint injection and assess for at least 75% relief. Consider SI fusion if successful. - If SI injections fail, discuss pain stimulant or hip replacement surgery.   Diagnoses and all orders for this visit:  Spinal stenosis of lumbar region with neurogenic claudication  Spondylosis of cervical region without myelopathy or radiculopathy    All questions have been answered. We have encouraged the patient to call us  if any further problems or questions should arise.   Attestation Statement:   I personally performed the service. (TP)  Elsie Lynwood Bunker, MD    Elsie Lynwood Bunker, MD Department of Orthopaedic Surgery and Rehabilitation Division of Spine Surgery Office: [phone] Clinic:  [phone]      *Some images could not be shown.

## 2023-09-04 ENCOUNTER — Encounter: Payer: Self-pay | Admitting: Otolaryngology

## 2023-09-04 ENCOUNTER — Other Ambulatory Visit: Payer: Self-pay | Admitting: Otolaryngology

## 2023-09-04 DIAGNOSIS — D167 Benign neoplasm of ribs, sternum and clavicle: Secondary | ICD-10-CM

## 2023-09-13 ENCOUNTER — Ambulatory Visit
Admission: RE | Admit: 2023-09-13 | Discharge: 2023-09-13 | Disposition: A | Discharge status: Home / Self Care | Source: Ambulatory Visit | Attending: Otolaryngology | Admitting: Otolaryngology

## 2023-09-13 DIAGNOSIS — D167 Benign neoplasm of ribs, sternum and clavicle: Secondary | ICD-10-CM

## 2023-09-13 MED ORDER — IOPAMIDOL (ISOVUE-300) INJECTION 61%
75.0000 mL | Freq: Once | INTRAVENOUS | Status: AC | PRN
  Administered 2023-09-13: 75 mL via INTRAVENOUS
